# Patient Record
Sex: Male | Born: 1975 | Race: White | Hispanic: No | Marital: Single | Smoking: Current every day smoker
Health system: Southern US, Community
[De-identification: ages and names within clinical notes are randomized; demographics above are authoritative.]

## PROBLEM LIST (undated history)

## (undated) DIAGNOSIS — K219 Gastro-esophageal reflux disease without esophagitis: Secondary | ICD-10-CM

## (undated) DIAGNOSIS — J45909 Unspecified asthma, uncomplicated: Secondary | ICD-10-CM

## (undated) DIAGNOSIS — M1990 Unspecified osteoarthritis, unspecified site: Secondary | ICD-10-CM

## (undated) DIAGNOSIS — I10 Essential (primary) hypertension: Secondary | ICD-10-CM

## (undated) DIAGNOSIS — Z8719 Personal history of other diseases of the digestive system: Secondary | ICD-10-CM

## (undated) HISTORY — PX: HERNIA REPAIR: SHX51

## (undated) HISTORY — PX: TONSILLECTOMY: SUR1361

## (undated) HISTORY — PX: OTHER SURGICAL HISTORY: SHX169

---

## 2019-03-02 ENCOUNTER — Other Ambulatory Visit: Payer: Self-pay

## 2019-03-02 DIAGNOSIS — Z20822 Contact with and (suspected) exposure to covid-19: Secondary | ICD-10-CM

## 2019-03-04 LAB — NOVEL CORONAVIRUS, NAA: SARS-CoV-2, NAA: NOT DETECTED

## 2019-07-31 DIAGNOSIS — L03114 Cellulitis of left upper limb: Secondary | ICD-10-CM

## 2019-09-08 ENCOUNTER — Other Ambulatory Visit: Payer: Self-pay | Admitting: Orthopedic Surgery

## 2019-11-16 ENCOUNTER — Encounter (HOSPITAL_COMMUNITY): Payer: Self-pay

## 2019-11-16 ENCOUNTER — Other Ambulatory Visit: Payer: Self-pay

## 2019-11-16 ENCOUNTER — Encounter (HOSPITAL_COMMUNITY)
Admission: RE | Admit: 2019-11-16 | Discharge: 2019-11-16 | Disposition: A | Discharge status: Home / Self Care | Payer: Self-pay | Source: Ambulatory Visit | Attending: Orthopedic Surgery | Admitting: Orthopedic Surgery

## 2019-11-16 ENCOUNTER — Other Ambulatory Visit (HOSPITAL_COMMUNITY)
Admission: RE | Admit: 2019-11-16 | Discharge: 2019-11-16 | Disposition: A | Discharge status: Home / Self Care | Payer: HRSA Program | Source: Ambulatory Visit | Attending: Orthopedic Surgery | Admitting: Orthopedic Surgery

## 2019-11-16 DIAGNOSIS — M4316 Spondylolisthesis, lumbar region: Secondary | ICD-10-CM | POA: Insufficient documentation

## 2019-11-16 DIAGNOSIS — Z01818 Encounter for other preprocedural examination: Secondary | ICD-10-CM | POA: Insufficient documentation

## 2019-11-16 DIAGNOSIS — M48061 Spinal stenosis, lumbar region without neurogenic claudication: Secondary | ICD-10-CM | POA: Insufficient documentation

## 2019-11-16 DIAGNOSIS — Z20822 Contact with and (suspected) exposure to covid-19: Secondary | ICD-10-CM | POA: Insufficient documentation

## 2019-11-16 DIAGNOSIS — I10 Essential (primary) hypertension: Secondary | ICD-10-CM | POA: Insufficient documentation

## 2019-11-16 DIAGNOSIS — Z01812 Encounter for preprocedural laboratory examination: Secondary | ICD-10-CM | POA: Diagnosis present

## 2019-11-16 HISTORY — DX: Unspecified osteoarthritis, unspecified site: M19.90

## 2019-11-16 HISTORY — DX: Essential (primary) hypertension: I10

## 2019-11-16 HISTORY — DX: Gastro-esophageal reflux disease without esophagitis: K21.9

## 2019-11-16 HISTORY — DX: Personal history of other diseases of the digestive system: Z87.19

## 2019-11-16 HISTORY — DX: Unspecified asthma, uncomplicated: J45.909

## 2019-11-16 LAB — COMPREHENSIVE METABOLIC PANEL
ALT: 14 U/L (ref 0–44)
AST: 13 U/L — ABNORMAL LOW (ref 15–41)
Albumin: 3.8 g/dL (ref 3.5–5.0)
Alkaline Phosphatase: 49 U/L (ref 38–126)
Anion gap: 8 (ref 5–15)
BUN: 16 mg/dL (ref 6–20)
CO2: 23 mmol/L (ref 22–32)
Calcium: 9.2 mg/dL (ref 8.9–10.3)
Chloride: 108 mmol/L (ref 98–111)
Creatinine, Ser: 0.8 mg/dL (ref 0.61–1.24)
GFR calc Af Amer: >60 mL/min (ref >60)
GFR calc non Af Amer: >60 mL/min (ref >60)
Glucose, Bld: 96 mg/dL (ref 70–99)
Potassium: 4.3 mmol/L (ref 3.5–5.1)
Sodium: 139 mmol/L (ref 135–145)
Total Bilirubin: 0.7 mg/dL (ref 0.3–1.2)
Total Protein: 6.5 g/dL (ref 6.5–8.1)

## 2019-11-16 LAB — CBC WITH DIFFERENTIAL/PLATELET
Abs Immature Granulocytes: 0.03 10*3/uL (ref 0.00–0.07)
Basophils Absolute: 0.1 10*3/uL (ref 0.0–0.1)
Basophils Relative: 1 %
Eosinophils Absolute: 0.3 10*3/uL (ref 0.0–0.5)
Eosinophils Relative: 4 %
HCT: 48.5 % (ref 39.0–52.0)
Hemoglobin: 16.6 g/dL (ref 13.0–17.0)
Immature Granulocytes: 0 %
Lymphocytes Relative: 31 %
Lymphs Abs: 2.3 10*3/uL (ref 0.7–4.0)
MCH: 33.1 pg (ref 26.0–34.0)
MCHC: 34.2 g/dL (ref 30.0–36.0)
MCV: 96.6 fL (ref 80.0–100.0)
Monocytes Absolute: 0.9 10*3/uL (ref 0.1–1.0)
Monocytes Relative: 11 %
Neutro Abs: 4 10*3/uL (ref 1.7–7.7)
Neutrophils Relative %: 53 %
Platelets: 354 10*3/uL (ref 150–400)
RBC: 5.02 MIL/uL (ref 4.22–5.81)
RDW: 12.8 % (ref 11.5–15.5)
WBC: 7.5 10*3/uL (ref 4.0–10.5)
nRBC: 0 % (ref 0.0–0.2)

## 2019-11-16 LAB — SARS CORONAVIRUS 2 (TAT 6-24 HRS): SARS Coronavirus 2: NEGATIVE

## 2019-11-16 LAB — URINALYSIS, ROUTINE W REFLEX MICROSCOPIC
Bacteria, UA: NONE SEEN
Bilirubin Urine: NEGATIVE
Glucose, UA: NEGATIVE mg/dL
Ketones, ur: NEGATIVE mg/dL
Leukocytes,Ua: NEGATIVE
Nitrite: NEGATIVE
Protein, ur: NEGATIVE mg/dL
Specific Gravity, Urine: 1.008 (ref 1.005–1.030)
pH: 5 (ref 5.0–8.0)

## 2019-11-16 LAB — TYPE AND SCREEN
ABO/RH(D): O POS
Antibody Screen: NEGATIVE

## 2019-11-16 LAB — SURGICAL PCR SCREEN
MRSA, PCR: NEGATIVE
Staphylococcus aureus: NEGATIVE

## 2019-11-16 LAB — PROTIME-INR
INR: 0.9 (ref 0.8–1.2)
Prothrombin Time: 11.7 seconds (ref 11.4–15.2)

## 2019-11-16 LAB — APTT: aPTT: 32 seconds (ref 24–36)

## 2019-11-16 NOTE — Pre-Procedure Instructions (Signed)
Gregory Dawson  11/16/2019    Your procedure is scheduled on Wednesday, November 18, 2019 at 8:30 AM.   Report to [facility] Entrance "A" Admitting Office at 6:30 AM.   Call this number if you have problems the morning of surgery: [phone]   Questions prior to day of surgery, please call [phone] between 8 & 4 PM.   Remember:  Do not eat food after midnight Tuesday, 11/17/19.  You may drink clear liquids until 5:30 AM.  Clear liquids allowed are: Water, Juice (non-citric and without pulp - diabetics please choose diet or no sugar options), Carbonated beverages - (diabetics please choose diet or no sugar options), Clear Tea, Black Coffee only (no creamer, milk or cream including half and half) and Gatorade (diabetics please choose diet or no sugar options)   Drink the Pre-Surgery Ensure drink between 5:15 AM and 5:30 AM day of surgery. This will be the last liquid you will drink prior to surgery.    Take these medicines the morning of surgery with A SIP OF WATER: Famotidine (Pepcid)   Stop Aspirin products (Goody's, BC Powders, etc) and NSAIDS (Mobic, Meloxicam, Aleve, Ibuprofen, etc) as of today prior to surgery. Do not use Multivitamins, Herbal medications or Fish oil prior to surgery.    Do not wear jewelry.  Do not wear lotions, powders, cologne or deodorant.  Men may shave face and neck.  Do not bring valuables to the hospital.  [facility] is not responsible for any belongings or valuables.  Contacts, dentures or bridgework may not be worn into surgery.  Leave your suitcase in the car.  After surgery it may be brought to your room.  For patients admitted to the hospital, discharge time will be determined by your treatment team.  [facility] - Preparing for Surgery  Before surgery, you can play an important role.  Because skin is not sterile, your skin needs to be as free of germs as possible.  You can reduce the number of germs on you skin by washing with CHG  (chlorahexidine gluconate) soap before surgery.  CHG is an antiseptic cleaner which kills germs and bonds with the skin to continue killing germs even after washing.  Oral Hygiene is also important in reducing the risk of infection.  Remember to brush your teeth with your regular toothpaste the morning of surgery.  Please DO NOT use if you have an allergy to CHG or antibacterial soaps.  If your skin becomes reddened/irritated stop using the CHG and inform your nurse when you arrive at Short Stay.  Do not shave (including legs and underarms) for at least 48 hours prior to the first CHG shower.  You may shave your face.  Please follow these instructions carefully:   1.  Shower with CHG Soap the night before surgery and the morning of Surgery.  2.  If you choose to wash your hair, wash your hair first as usual with your normal shampoo.  3.  After you shampoo, rinse your hair and body thoroughly to remove the shampoo. 4.  Use CHG as you would any other liquid soap.  You can apply chg directly to the skin and wash gently with a      scrungie or washcloth.           5.  Apply the CHG Soap to your body ONLY FROM THE NECK DOWN.   Do not use on open wounds or open sores. Avoid contact with your eyes, ears, mouth and  genitals (private parts).  Wash genitals (private parts) with your normal soap - do this prior to using CHG soap.  6.  Wash thoroughly, paying special attention to the area where your surgery will be performed.  7.  Thoroughly rinse your body with warm water from the neck down.  8.  DO NOT shower/wash with your normal soap after using and rinsing off the CHG Soap.  9.  Pat yourself dry with a clean towel.            10.  Wear clean pajamas.            11.  Place clean sheets on your bed the night of your first shower and do not sleep with pets.  Day of Surgery  Shower as above. Do not apply any lotions/deodorants the morning of surgery.   Please wear clean clothes to the  hospital. Remember to brush your teeth with toothpaste.  Please read over the fact sheets that you were given.

## 2019-11-16 NOTE — Progress Notes (Signed)
PCP - none.Marland KitchenPATIENT GOES TO ED OR URGENT IF NEEDED Cardiologist - NA   Chest x-ray - NA EKG - DONE TODAY Stress Test NA-  ECHO - NA Cardiac Cath - NA   -  Blood Thinner Instructions:NA Aspirin Instructions:STOP  ERAS Protcol -YES     PRE-SURGERY Ensure or G2- DRINK GIVEN  COVID TEST- FOR TODAY   Anesthesia review: HX HTN  Patient denies shortness of breath, fever, cough and chest pain at PAT appointment   All instructions explained to the patient, with a verbal understanding of the material. Patient agrees to go over the instructions while at home for a better understanding. Patient also instructed to self quarantine after being tested for COVID-19. The opportunity to ask questions was provided.

## 2019-11-17 NOTE — Anesthesia Preprocedure Evaluation (Addendum)
Anesthesia Evaluation  Patient identified by MRN, date of birth, ID band Patient awake    Reviewed: Allergy & Precautions, NPO status , Patient's Chart, lab work & pertinent test results  History of Anesthesia Complications Negative for: history of anesthetic complications  Airway Mallampati: I  TM Distance: >3 FB Neck ROM: Full    Dental  (+) Missing, Poor Dentition, Dental Advisory Given   Pulmonary asthma , Current SmokerPatient did not abstain from smoking.,     + wheezing      Cardiovascular hypertension, Pt. on medications negative cardio ROS Normal cardiovascular exam     Neuro/Psych    GI/Hepatic Neg liver ROS, hiatal hernia, GERD  Medicated,  Endo/Other  negative endocrine ROS  Renal/GU negative Renal ROS     Musculoskeletal negative musculoskeletal ROS (+)   Abdominal   Peds  Hematology negative hematology ROS (+)   Anesthesia Other Findings   Reproductive/Obstetrics                            Anesthesia Physical Anesthesia Plan  ASA: II  Anesthesia Plan: General   Post-op Pain Management:    Induction: Intravenous  PONV Risk Score and Plan: 2 and Ondansetron and Dexamethasone  Airway Management Planned: Oral ETT  Additional Equipment:   Intra-op Plan:   Post-operative Plan: Extubation in OR  Informed Consent: I have reviewed the patients History and Physical, chart, labs and discussed the procedure including the risks, benefits and alternatives for the proposed anesthesia with the patient or authorized representative who has indicated his/her understanding and acceptance.     Dental advisory given  Plan Discussed with: Anesthesiologist and CRNA  Anesthesia Plan Comments:        Anesthesia Quick Evaluation

## 2019-11-18 ENCOUNTER — Encounter (HOSPITAL_COMMUNITY): Payer: Self-pay | Admitting: Orthopedic Surgery

## 2019-11-18 ENCOUNTER — Inpatient Hospital Stay (HOSPITAL_COMMUNITY): Payer: No Typology Code available for payment source | Admitting: Anesthesiology

## 2019-11-18 ENCOUNTER — Other Ambulatory Visit: Payer: Self-pay

## 2019-11-18 ENCOUNTER — Inpatient Hospital Stay (HOSPITAL_COMMUNITY)
Admission: RE | Disposition: A | Discharge status: Home / Self Care | Payer: Self-pay | Source: Home / Self Care | Attending: Orthopedic Surgery

## 2019-11-18 ENCOUNTER — Inpatient Hospital Stay (HOSPITAL_COMMUNITY)
Admission: RE | Admit: 2019-11-18 | Discharge: 2019-11-19 | DRG: 455 | Disposition: A | Discharge status: Home / Self Care | Payer: No Typology Code available for payment source | Attending: Orthopedic Surgery | Admitting: Orthopedic Surgery

## 2019-11-18 ENCOUNTER — Ambulatory Visit (HOSPITAL_COMMUNITY): Payer: Self-pay | Attending: Orthopedic Surgery

## 2019-11-18 ENCOUNTER — Inpatient Hospital Stay (HOSPITAL_COMMUNITY): Payer: Self-pay

## 2019-11-18 ENCOUNTER — Inpatient Hospital Stay (HOSPITAL_COMMUNITY): Payer: No Typology Code available for payment source | Admitting: Physician Assistant

## 2019-11-18 DIAGNOSIS — Z79899 Other long term (current) drug therapy: Secondary | ICD-10-CM

## 2019-11-18 DIAGNOSIS — M48062 Spinal stenosis, lumbar region with neurogenic claudication: Secondary | ICD-10-CM | POA: Diagnosis present

## 2019-11-18 DIAGNOSIS — Z419 Encounter for procedure for purposes other than remedying health state, unspecified: Secondary | ICD-10-CM

## 2019-11-18 DIAGNOSIS — Z7982 Long term (current) use of aspirin: Secondary | ICD-10-CM

## 2019-11-18 DIAGNOSIS — I10 Essential (primary) hypertension: Secondary | ICD-10-CM | POA: Diagnosis present

## 2019-11-18 DIAGNOSIS — Y990 Civilian activity done for income or pay: Secondary | ICD-10-CM | POA: Diagnosis not present

## 2019-11-18 DIAGNOSIS — K219 Gastro-esophageal reflux disease without esophagitis: Secondary | ICD-10-CM | POA: Diagnosis present

## 2019-11-18 DIAGNOSIS — M4316 Spondylolisthesis, lumbar region: Secondary | ICD-10-CM | POA: Diagnosis present

## 2019-11-18 DIAGNOSIS — M4800 Spinal stenosis, site unspecified: Secondary | ICD-10-CM | POA: Diagnosis present

## 2019-11-18 DIAGNOSIS — M5116 Intervertebral disc disorders with radiculopathy, lumbar region: Secondary | ICD-10-CM | POA: Diagnosis present

## 2019-11-18 DIAGNOSIS — M1990 Unspecified osteoarthritis, unspecified site: Secondary | ICD-10-CM | POA: Diagnosis present

## 2019-11-18 DIAGNOSIS — T1490XS Injury, unspecified, sequela: Secondary | ICD-10-CM

## 2019-11-18 DIAGNOSIS — F17210 Nicotine dependence, cigarettes, uncomplicated: Secondary | ICD-10-CM | POA: Diagnosis present

## 2019-11-18 DIAGNOSIS — Z791 Long term (current) use of non-steroidal anti-inflammatories (NSAID): Secondary | ICD-10-CM

## 2019-11-18 HISTORY — PX: TRANSFORAMINAL LUMBAR INTERBODY FUSION (TLIF) WITH PEDICLE SCREW FIXATION 1 LEVEL: SHX6141

## 2019-11-18 LAB — ABO/RH: ABO/RH(D): O POS

## 2019-11-18 SURGERY — TRANSFORAMINAL LUMBAR INTERBODY FUSION (TLIF) WITH PEDICLE SCREW FIXATION 1 LEVEL
Anesthesia: General | Site: Back | Laterality: Left

## 2019-11-18 MED ORDER — CHLORHEXIDINE GLUCONATE 0.12 % MT SOLN
15.0000 mL | Freq: Once | OROMUCOSAL | Status: AC
  Administered 2019-11-18: 15 mL via OROMUCOSAL
  Filled 2019-11-18: qty 15

## 2019-11-18 MED ORDER — PHENOL 1.4 % MT LIQD: 1.0000 | OROMUCOSAL | Status: DC | PRN

## 2019-11-18 MED ORDER — PROMETHAZINE HCL 25 MG/ML IJ SOLN: 6.2500 mg | INTRAMUSCULAR | Status: DC | PRN

## 2019-11-18 MED ORDER — ALBUMIN HUMAN 5 % IV SOLN: INTRAVENOUS | Status: DC | PRN

## 2019-11-18 MED ORDER — DOCUSATE SODIUM 100 MG PO CAPS
100.0000 mg | ORAL_CAPSULE | Freq: Two times a day (BID) | ORAL | Status: DC
  Administered 2019-11-18: 100 mg via ORAL
  Filled 2019-11-18: qty 1

## 2019-11-18 MED ORDER — POTASSIUM CHLORIDE IN NACL 20-0.9 MEQ/L-% IV SOLN: INTRAVENOUS | Status: DC

## 2019-11-18 MED ORDER — METHOCARBAMOL 1000 MG/10ML IJ SOLN
500.0000 mg | Freq: Four times a day (QID) | INTRAVENOUS | Status: DC | PRN
  Administered 2019-11-19: 500 mg via INTRAVENOUS
  Filled 2019-11-18 (×2): qty 5

## 2019-11-18 MED ORDER — ONDANSETRON HCL 4 MG/2ML IJ SOLN
INTRAMUSCULAR | Status: AC
  Filled 2019-11-18: qty 2

## 2019-11-18 MED ORDER — ACETAMINOPHEN 650 MG RE SUPP: 650.0000 mg | RECTAL | Status: DC | PRN

## 2019-11-18 MED ORDER — PROPOFOL 500 MG/50ML IV EMUL
INTRAVENOUS | Status: DC | PRN
  Administered 2019-11-18: 25 ug/kg/min via INTRAVENOUS

## 2019-11-18 MED ORDER — ALBUTEROL SULFATE (2.5 MG/3ML) 0.083% IN NEBU
2.5000 mg | INHALATION_SOLUTION | Freq: Once | RESPIRATORY_TRACT | Status: AC
  Administered 2019-11-18: 2.5 mg via RESPIRATORY_TRACT
  Filled 2019-11-18: qty 3

## 2019-11-18 MED ORDER — FLEET ENEMA 7-19 GM/118ML RE ENEM: 1.0000 | ENEMA | Freq: Once | RECTAL | Status: DC | PRN

## 2019-11-18 MED ORDER — MENTHOL 3 MG MT LOZG: 1.0000 | LOZENGE | OROMUCOSAL | Status: DC | PRN

## 2019-11-18 MED ORDER — CEFAZOLIN SODIUM-DEXTROSE 2-4 GM/100ML-% IV SOLN
2.0000 g | INTRAVENOUS | Status: AC
  Administered 2019-11-18: 2 g via INTRAVENOUS
  Filled 2019-11-18: qty 100

## 2019-11-18 MED ORDER — SUGAMMADEX SODIUM 200 MG/2ML IV SOLN
INTRAVENOUS | Status: DC | PRN
  Administered 2019-11-18: 200 mg via INTRAVENOUS

## 2019-11-18 MED ORDER — METHYLENE BLUE 0.5 % INJ SOLN
INTRAVENOUS | Status: AC
  Filled 2019-11-18: qty 10

## 2019-11-18 MED ORDER — FENTANYL CITRATE (PF) 250 MCG/5ML IJ SOLN
INTRAMUSCULAR | Status: AC
  Filled 2019-11-18: qty 5

## 2019-11-18 MED ORDER — MORPHINE SULFATE (PF) 2 MG/ML IV SOLN
1.0000 mg | INTRAVENOUS | Status: DC | PRN
  Administered 2019-11-18 (×2): 2 mg via INTRAVENOUS
  Filled 2019-11-18 (×2): qty 1

## 2019-11-18 MED ORDER — PHENYLEPHRINE HCL-NACL 10-0.9 MG/250ML-% IV SOLN
INTRAVENOUS | Status: DC | PRN
  Administered 2019-11-18: 30 ug/min via INTRAVENOUS

## 2019-11-18 MED ORDER — PROPOFOL 10 MG/ML IV BOLUS
INTRAVENOUS | Status: AC
  Filled 2019-11-18: qty 20

## 2019-11-18 MED ORDER — MIDAZOLAM HCL 2 MG/2ML IJ SOLN
INTRAMUSCULAR | Status: AC
  Filled 2019-11-18: qty 2

## 2019-11-18 MED ORDER — MIDAZOLAM HCL 2 MG/2ML IJ SOLN
INTRAMUSCULAR | Status: DC | PRN
  Administered 2019-11-18: 2 mg via INTRAVENOUS

## 2019-11-18 MED ORDER — OXYCODONE-ACETAMINOPHEN 5-325 MG PO TABS
1.0000 | ORAL_TABLET | ORAL | Status: DC | PRN
  Administered 2019-11-18: 1 via ORAL
  Administered 2019-11-19: 2 via ORAL
  Administered 2019-11-19 (×2): 1 via ORAL
  Filled 2019-11-18: qty 2
  Filled 2019-11-18 (×2): qty 1
  Filled 2019-11-18: qty 2

## 2019-11-18 MED ORDER — BUPIVACAINE LIPOSOME 1.3 % IJ SUSP
20.0000 mL | Freq: Once | INTRAMUSCULAR | Status: DC
  Filled 2019-11-18: qty 20

## 2019-11-18 MED ORDER — SENNOSIDES-DOCUSATE SODIUM 8.6-50 MG PO TABS: 1.0000 | ORAL_TABLET | Freq: Every evening | ORAL | Status: DC | PRN

## 2019-11-18 MED ORDER — ONDANSETRON HCL 4 MG/2ML IJ SOLN
4.0000 mg | Freq: Four times a day (QID) | INTRAMUSCULAR | Status: DC | PRN
  Administered 2019-11-18: 4 mg via INTRAVENOUS

## 2019-11-18 MED ORDER — METHYLENE BLUE 0.5 % INJ SOLN
INTRAVENOUS | Status: DC | PRN
  Administered 2019-11-18: .4 mL via SUBMUCOSAL

## 2019-11-18 MED ORDER — SODIUM CHLORIDE (PF) 0.9 % IJ SOLN
INTRAMUSCULAR | Status: DC | PRN
  Administered 2019-11-18: 20 mL

## 2019-11-18 MED ORDER — OXYCODONE-ACETAMINOPHEN 5-325 MG PO TABS
1.0000 | ORAL_TABLET | ORAL | 0 refills | Status: AC | PRN
Start: 2019-11-18 — End: ?

## 2019-11-18 MED ORDER — FENTANYL CITRATE (PF) 250 MCG/5ML IJ SOLN
INTRAMUSCULAR | Status: DC | PRN
  Administered 2019-11-18: 150 ug via INTRAVENOUS
  Administered 2019-11-18: 100 ug via INTRAVENOUS
  Administered 2019-11-18 (×3): 50 ug via INTRAVENOUS
  Administered 2019-11-18: 100 ug via INTRAVENOUS

## 2019-11-18 MED ORDER — GLYCOPYRROLATE PF 0.2 MG/ML IJ SOSY
PREFILLED_SYRINGE | INTRAMUSCULAR | Status: DC | PRN
  Administered 2019-11-18: .2 mg via INTRAVENOUS

## 2019-11-18 MED ORDER — THROMBIN (RECOMBINANT) 20000 UNITS EX SOLR
CUTANEOUS | Status: AC
  Filled 2019-11-18: qty 20000

## 2019-11-18 MED ORDER — ZOLPIDEM TARTRATE 5 MG PO TABS: 5.0000 mg | ORAL_TABLET | Freq: Every evening | ORAL | Status: DC | PRN

## 2019-11-18 MED ORDER — METHOCARBAMOL 500 MG PO TABS
500.0000 mg | ORAL_TABLET | Freq: Four times a day (QID) | ORAL | Status: DC | PRN
  Administered 2019-11-18 – 2019-11-19 (×3): 500 mg via ORAL
  Filled 2019-11-18 (×4): qty 1

## 2019-11-18 MED ORDER — HYDROXYZINE HCL 50 MG/ML IM SOLN
50.0000 mg | Freq: Four times a day (QID) | INTRAMUSCULAR | Status: DC | PRN
  Administered 2019-11-18: 50 mg via INTRAMUSCULAR
  Filled 2019-11-18: qty 1

## 2019-11-18 MED ORDER — SODIUM CHLORIDE 0.9% FLUSH: 3.0000 mL | Freq: Two times a day (BID) | INTRAVENOUS | Status: DC

## 2019-11-18 MED ORDER — SODIUM CHLORIDE 0.9 % IV SOLN: 250.0000 mL | INTRAVENOUS | Status: DC

## 2019-11-18 MED ORDER — EPINEPHRINE PF 1 MG/ML IJ SOLN
INTRAMUSCULAR | Status: AC
  Filled 2019-11-18: qty 1

## 2019-11-18 MED ORDER — ACETAMINOPHEN 500 MG PO TABS
1000.0000 mg | ORAL_TABLET | Freq: Once | ORAL | Status: AC
  Administered 2019-11-18: 1000 mg via ORAL
  Filled 2019-11-18: qty 2

## 2019-11-18 MED ORDER — ONDANSETRON HCL 4 MG PO TABS: 4.0000 mg | ORAL_TABLET | Freq: Four times a day (QID) | ORAL | Status: DC | PRN

## 2019-11-18 MED ORDER — ROCURONIUM BROMIDE 10 MG/ML (PF) SYRINGE
PREFILLED_SYRINGE | INTRAVENOUS | Status: AC
  Filled 2019-11-18: qty 10

## 2019-11-18 MED ORDER — ALUM & MAG HYDROXIDE-SIMETH 200-200-20 MG/5ML PO SUSP
30.0000 mL | Freq: Four times a day (QID) | ORAL | Status: DC | PRN
  Administered 2019-11-18: 30 mL via ORAL
  Filled 2019-11-18: qty 30

## 2019-11-18 MED ORDER — SODIUM CHLORIDE 0.9% FLUSH: 3.0000 mL | INTRAVENOUS | Status: DC | PRN

## 2019-11-18 MED ORDER — POVIDONE-IODINE 7.5 % EX SOLN: Freq: Once | CUTANEOUS | Status: DC

## 2019-11-18 MED ORDER — THROMBIN 20000 UNITS EX SOLR
CUTANEOUS | Status: DC | PRN
  Administered 2019-11-18: 20000 [IU] via TOPICAL

## 2019-11-18 MED ORDER — PROPOFOL 1000 MG/100ML IV EMUL
INTRAVENOUS | Status: AC
  Filled 2019-11-18: qty 100

## 2019-11-18 MED ORDER — BUPIVACAINE HCL (PF) 0.25 % IJ SOLN
INTRAMUSCULAR | Status: AC
  Filled 2019-11-18: qty 30

## 2019-11-18 MED ORDER — FENTANYL CITRATE (PF) 100 MCG/2ML IJ SOLN: 25.0000 ug | INTRAMUSCULAR | Status: DC | PRN

## 2019-11-18 MED ORDER — ONDANSETRON HCL 4 MG/2ML IJ SOLN
INTRAMUSCULAR | Status: DC | PRN
  Administered 2019-11-18: 4 mg via INTRAVENOUS

## 2019-11-18 MED ORDER — LISINOPRIL 10 MG PO TABS: 10.0000 mg | ORAL_TABLET | Freq: Every day | ORAL | Status: DC

## 2019-11-18 MED ORDER — ROCURONIUM BROMIDE 10 MG/ML (PF) SYRINGE
PREFILLED_SYRINGE | INTRAVENOUS | Status: DC | PRN
  Administered 2019-11-18: 100 mg via INTRAVENOUS
  Administered 2019-11-18: 20 mg via INTRAVENOUS

## 2019-11-18 MED ORDER — ORAL CARE MOUTH RINSE: 15.0000 mL | Freq: Once | OROMUCOSAL | Status: AC

## 2019-11-18 MED ORDER — LIDOCAINE 2% (20 MG/ML) 5 ML SYRINGE
INTRAMUSCULAR | Status: DC | PRN
  Administered 2019-11-18: 100 mg via INTRAVENOUS

## 2019-11-18 MED ORDER — BISACODYL 5 MG PO TBEC: 5.0000 mg | DELAYED_RELEASE_TABLET | Freq: Every day | ORAL | Status: DC | PRN

## 2019-11-18 MED ORDER — PROPOFOL 10 MG/ML IV BOLUS
INTRAVENOUS | Status: DC | PRN
  Administered 2019-11-18: 200 mg via INTRAVENOUS

## 2019-11-18 MED ORDER — CELECOXIB 200 MG PO CAPS
200.0000 mg | ORAL_CAPSULE | Freq: Once | ORAL | Status: AC
  Administered 2019-11-18: 200 mg via ORAL
  Filled 2019-11-18: qty 1

## 2019-11-18 MED ORDER — FAMOTIDINE 20 MG PO TABS
20.0000 mg | ORAL_TABLET | Freq: Two times a day (BID) | ORAL | Status: DC
  Administered 2019-11-18: 20 mg via ORAL
  Filled 2019-11-18: qty 1

## 2019-11-18 MED ORDER — 0.9 % SODIUM CHLORIDE (POUR BTL) OPTIME
TOPICAL | Status: DC | PRN
  Administered 2019-11-18 (×3): 1000 mL

## 2019-11-18 MED ORDER — BUPIVACAINE LIPOSOME 1.3 % IJ SUSP
INTRAMUSCULAR | Status: DC | PRN
  Administered 2019-11-18: 20 mL

## 2019-11-18 MED ORDER — METHOCARBAMOL 500 MG PO TABS: 500.0000 mg | ORAL_TABLET | Freq: Four times a day (QID) | ORAL | 1 refills | Status: AC | PRN

## 2019-11-18 MED ORDER — CEFAZOLIN SODIUM-DEXTROSE 2-4 GM/100ML-% IV SOLN
2.0000 g | Freq: Three times a day (TID) | INTRAVENOUS | Status: AC
  Administered 2019-11-18 – 2019-11-19 (×2): 2 g via INTRAVENOUS
  Filled 2019-11-18 (×2): qty 100

## 2019-11-18 MED ORDER — ACETAMINOPHEN 325 MG PO TABS
650.0000 mg | ORAL_TABLET | ORAL | Status: DC | PRN
  Filled 2019-11-18: qty 2

## 2019-11-18 MED ORDER — GLYCOPYRROLATE PF 0.2 MG/ML IJ SOSY
PREFILLED_SYRINGE | INTRAMUSCULAR | Status: AC
  Filled 2019-11-18: qty 1

## 2019-11-18 MED ORDER — BUPIVACAINE-EPINEPHRINE 0.25% -1:200000 IJ SOLN
INTRAMUSCULAR | Status: DC | PRN
  Administered 2019-11-18: 4 mL
  Administered 2019-11-18: 20 mL

## 2019-11-18 MED ORDER — LIDOCAINE 2% (20 MG/ML) 5 ML SYRINGE
INTRAMUSCULAR | Status: AC
  Filled 2019-11-18: qty 5

## 2019-11-18 MED ORDER — LACTATED RINGERS IV SOLN: INTRAVENOUS | Status: DC

## 2019-11-18 SURGICAL SUPPLY — 94 items
BENZOIN TINCTURE PRP APPL 2/3 (GAUZE/BANDAGES/DRESSINGS) ×3 IMPLANT
BLADE CLIPPER SURG (BLADE) IMPLANT
BONE VIVIGEN FORMABLE 10CC (Bone Implant) ×3 IMPLANT
BUR PRESCISION 1.7 ELITE (BURR) ×3 IMPLANT
BUR ROUND FLUTED 5 RND (BURR) ×2 IMPLANT
BUR ROUND FLUTED 5MM RND (BURR) ×1
BUR ROUND PRECISION 4.0 (BURR) IMPLANT
BUR ROUND PRECISION 4.0MM (BURR)
BUR SABER RD CUTTING 3.0 (BURR) IMPLANT
BUR SABER RD CUTTING 3.0MM (BURR)
CAGE CONCORDE BULLET 9X11X27 (Cage) ×1 IMPLANT
CAGE CONCORDE BULLET 9X11X27MM (Cage) ×1 IMPLANT
CAGE SPNL 5D BLT NOSE 27X9X11 (Cage) ×1 IMPLANT
CARTRIDGE OIL MAESTRO DRILL (MISCELLANEOUS) ×1 IMPLANT
CLOSURE STERI-STRIP 1/2X4 (GAUZE/BANDAGES/DRESSINGS) ×1
CLOSURE WOUND 1/2 X4 (GAUZE/BANDAGES/DRESSINGS) ×2
CLSR STERI-STRIP ANTIMIC 1/2X4 (GAUZE/BANDAGES/DRESSINGS) ×2 IMPLANT
CNTNR URN SCR LID CUP LEK RST (MISCELLANEOUS) ×1 IMPLANT
CONT SPEC 4OZ STRL OR WHT (MISCELLANEOUS) ×2
COVER BACK TABLE 60X90IN (DRAPES) ×3 IMPLANT
COVER MAYO STAND STRL (DRAPES) ×6 IMPLANT
COVER SURGICAL LIGHT HANDLE (MISCELLANEOUS) ×3 IMPLANT
COVER WAND RF STERILE (DRAPES) ×3 IMPLANT
DIFFUSER DRILL AIR PNEUMATIC (MISCELLANEOUS) ×3 IMPLANT
DRAIN CHANNEL 15F RND FF W/TCR (WOUND CARE) IMPLANT
DRAPE C-ARM 42X72 X-RAY (DRAPES) ×3 IMPLANT
DRAPE C-ARMOR (DRAPES) IMPLANT
DRAPE POUCH INSTRU U-SHP 10X18 (DRAPES) ×3 IMPLANT
DRAPE SURG 17X23 STRL (DRAPES) ×12 IMPLANT
DURAPREP 26ML APPLICATOR (WOUND CARE) ×3 IMPLANT
ELECT BLADE 4.0 EZ CLEAN MEGAD (MISCELLANEOUS) ×3
ELECT CAUTERY BLADE 6.4 (BLADE) ×3 IMPLANT
ELECT REM PT RETURN 9FT ADLT (ELECTROSURGICAL) ×3
ELECTRODE BLDE 4.0 EZ CLN MEGD (MISCELLANEOUS) ×1 IMPLANT
ELECTRODE REM PT RTRN 9FT ADLT (ELECTROSURGICAL) ×1 IMPLANT
EVACUATOR SILICONE 100CC (DRAIN) IMPLANT
FEE INTRAOP MONITOR IMPULS NCS (MISCELLANEOUS) ×1 IMPLANT
FILTER STRAW FLUID ASPIR (MISCELLANEOUS) ×3 IMPLANT
GAUZE 4X4 16PLY RFD (DISPOSABLE) ×3 IMPLANT
GAUZE SPONGE 4X4 12PLY STRL (GAUZE/BANDAGES/DRESSINGS) ×3 IMPLANT
GLOVE BIO SURGEON STRL SZ7 (GLOVE) ×3 IMPLANT
GLOVE BIO SURGEON STRL SZ8 (GLOVE) ×3 IMPLANT
GLOVE BIOGEL PI IND STRL 7.0 (GLOVE) ×1 IMPLANT
GLOVE BIOGEL PI IND STRL 8 (GLOVE) ×1 IMPLANT
GLOVE BIOGEL PI INDICATOR 7.0 (GLOVE) ×2
GLOVE BIOGEL PI INDICATOR 8 (GLOVE) ×2
GOWN STRL REUS W/ TWL LRG LVL3 (GOWN DISPOSABLE) ×2 IMPLANT
GOWN STRL REUS W/ TWL XL LVL3 (GOWN DISPOSABLE) ×1 IMPLANT
GOWN STRL REUS W/TWL LRG LVL3 (GOWN DISPOSABLE) ×4
GOWN STRL REUS W/TWL XL LVL3 (GOWN DISPOSABLE) ×2
INTRAOP MONITOR FEE IMPULS NCS (MISCELLANEOUS) ×1
INTRAOP MONITOR FEE IMPULSE (MISCELLANEOUS) ×2
IV CATH 14GX2 1/4 (CATHETERS) ×3 IMPLANT
KIT BASIN OR (CUSTOM PROCEDURE TRAY) ×3 IMPLANT
KIT POSITION SURG JACKSON T1 (MISCELLANEOUS) ×3 IMPLANT
KIT TURNOVER KIT B (KITS) ×3 IMPLANT
MARKER SKIN DUAL TIP RULER LAB (MISCELLANEOUS) ×6 IMPLANT
NEEDLE 18GX1X1/2 (RX/OR ONLY) (NEEDLE) ×3 IMPLANT
NEEDLE 22X1 1/2 (OR ONLY) (NEEDLE) ×6 IMPLANT
NEEDLE HYPO 25GX1X1/2 BEV (NEEDLE) ×3 IMPLANT
NEEDLE SPNL 18GX3.5 QUINCKE PK (NEEDLE) ×6 IMPLANT
NS IRRIG 1000ML POUR BTL (IV SOLUTION) ×3 IMPLANT
OIL CARTRIDGE MAESTRO DRILL (MISCELLANEOUS) ×3
PACK LAMINECTOMY ORTHO (CUSTOM PROCEDURE TRAY) ×3 IMPLANT
PACK UNIVERSAL I (CUSTOM PROCEDURE TRAY) ×3 IMPLANT
PAD ARMBOARD 7.5X6 YLW CONV (MISCELLANEOUS) ×6 IMPLANT
PATTIES SURGICAL .5 X1 (DISPOSABLE) ×3 IMPLANT
PATTIES SURGICAL .5X1.5 (GAUZE/BANDAGES/DRESSINGS) ×3 IMPLANT
PROBE PED SCREW MONOP 3 BT NCS (MISCELLANEOUS) ×1 IMPLANT
ROD PRE BENT EXPEDIUM 35MM (Rod) ×6 IMPLANT
SCREW PROBE PEDICLE MONOPOLAR (MISCELLANEOUS) ×2
SCREW SET SINGLE INNER (Screw) ×12 IMPLANT
SCREW VIPER CORT FIX 6.00X30 (Screw) ×12 IMPLANT
SPONGE INTESTINAL PEANUT (DISPOSABLE) ×3 IMPLANT
SPONGE SURGIFOAM ABS GEL 100 (HEMOSTASIS) ×3 IMPLANT
STRIP CLOSURE SKIN 1/2X4 (GAUZE/BANDAGES/DRESSINGS) ×4 IMPLANT
SURGIFLO W/THROMBIN 8M KIT (HEMOSTASIS) IMPLANT
SUT MNCRL AB 4-0 PS2 18 (SUTURE) ×3 IMPLANT
SUT VIC AB 0 CT1 18XCR BRD 8 (SUTURE) ×1 IMPLANT
SUT VIC AB 0 CT1 8-18 (SUTURE) ×2
SUT VIC AB 1 CT1 18XCR BRD 8 (SUTURE) ×1 IMPLANT
SUT VIC AB 1 CT1 8-18 (SUTURE) ×2
SUT VIC AB 2-0 CT2 18 VCP726D (SUTURE) ×3 IMPLANT
SYR 20ML LL LF (SYRINGE) ×6 IMPLANT
SYR BULB IRRIG 60ML STRL (SYRINGE) ×3 IMPLANT
SYR CONTROL 10ML LL (SYRINGE) ×6 IMPLANT
SYR TB 1ML LUER SLIP (SYRINGE) ×3 IMPLANT
TAP EXPEDIUM DL 4.35 (INSTRUMENTS) ×3 IMPLANT
TAP EXPEDIUM DL 5.0 (INSTRUMENTS) ×3 IMPLANT
TAP EXPEDIUM DL 6.0 (INSTRUMENTS) ×3 IMPLANT
TAPE CLOTH SURG 4X10 WHT LF (GAUZE/BANDAGES/DRESSINGS) ×3 IMPLANT
TRAY FOLEY MTR SLVR 16FR STAT (SET/KITS/TRAYS/PACK) ×3 IMPLANT
WATER STERILE IRR 1000ML POUR (IV SOLUTION) ×3 IMPLANT
YANKAUER SUCT BULB TIP NO VENT (SUCTIONS) ×3 IMPLANT

## 2019-11-18 NOTE — H&P (Signed)
PREOPERATIVE H&P  Chief Complaint: Bilateral leg pain  HPI: Gregory Dawson is a 44 y.o. male who presents with ongoing pain in the bilateral legs  MRI reveals severe spinal stenosis and instability at L4/5  Patient has failed multiple forms of conservative care and continues to have pain (see office notes for additional details regarding the patient's full course of treatment)  Past Medical History:  Diagnosis Date  . Arthritis   . Asthma   . GERD (gastroesophageal reflux disease)   . History of hiatal hernia   . Hypertension     Social History   Socioeconomic History  . Marital status: Single    Spouse name: Not on file  . Number of children: Not on file  . Years of education: Not on file  . Highest education level: Not on file  Occupational History  . Not on file  Tobacco Use  . Smoking status: Current Every Day Smoker    Packs/day: 1.00    Years: 35.00    Pack years: 35.00    Types: Cigarettes  Substance and Sexual Activity  . 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 Drug use: Not Currently    Types: Heroin, "Crack" cocaine, Marijuana    Comment: 4 yrs clean  . Sexual activity: Not on file  Other Topics Concern  . Not on file  Social History Narrative  . Not on file   Social Determinants of Health   Financial Resource Strain:   . Difficulty of Paying Living Expenses:   Food Insecurity:   . Worried About Programme researcher, broadcasting/film/video in the Last Year:   . Barista in the Last Year:   Transportation Needs:   . Freight forwarder (Medical):   Marland Kitchen Lack of Transportation (Non-Medical):   Physical Activity:   . Days of Exercise per Week:   . Minutes of Exercise per Session:   Stress:   . Feeling of Stress :   Social Connections:   . Frequency of Communication with Friends and Family:   . Frequency of Social Gatherings with Friends and Family:   . Attends Religious Services:   . Active Member of Clubs or Organizations:   . Attends Occupational hygienist Meetings:   Marland Kitchen Marital Status:    No family history on file. No Known Allergies Prior to Admission medications   Medication Sig Start Date End Date Taking? Authorizing Provider  Aspirin-Acetaminophen-Caffeine (GOODYS EXTRA STRENGTH) [phone] MG PACK Take 1 packet by mouth daily as needed (pain/headache).   Yes [provider]  famotidine (PEPCID) 20 MG tablet Take 20 mg by mouth 2 (two) times daily.   Yes [provider]  lisinopril (ZESTRIL) 10 MG tablet Take 10 mg by mouth daily. 10/21/19  Yes [provider]  meloxicam (MOBIC) 15 MG tablet Take 15 mg by mouth daily. 10/31/19  Yes [provider]     All other systems have been reviewed and were otherwise negative with the exception of those mentioned in the HPI and as above.  Physical Exam: Vitals:   11/18/19 0735  BP: (!) 129/93  Pulse: 61  Resp: 20  Temp: 97.6 F (36.4 C)  SpO2: 97%    Body mass index is 29.05 kg/m.  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

## 2019-11-18 NOTE — Anesthesia Postprocedure Evaluation (Signed)
Anesthesia Post Note  Patient: Gregory Dawson  Procedure(s) Performed: LEFT-SIDED LUMBAR 4- LUMBAR 5 TRANSFORAMINAL LUMBAR INTERBODY FUSION WITH INSTRUMENTATION AND ALLOGRAFT (Left Back)     Patient location during evaluation: PACU Anesthesia Type: General Level of consciousness: sedated Pain management: pain level controlled Vital Signs Assessment: post-procedure vital signs reviewed and stable Respiratory status: spontaneous breathing and respiratory function stable Cardiovascular status: stable Postop Assessment: no apparent nausea or vomiting Anesthetic complications: no   No complications documented.  Last Vitals:  Vitals:   11/18/19 1350 11/18/19 1428  BP: (!) 150/96 (!) 165/98  Pulse: 66 62  Resp: 15 16  Temp: 36.6 C 36.4 C  SpO2: 96% 99%    Last Pain:  Vitals:   11/18/19 1510  TempSrc:   PainSc: 5                  Orene Abbasi DANIEL

## 2019-11-18 NOTE — Anesthesia Procedure Notes (Signed)
Procedure Name: Intubation Date/Time: 11/18/2019 8:47 AM Performed by: Shary Decamp, CRNA Pre-anesthesia Checklist: Patient identified, Emergency Drugs available, Suction available and Patient being monitored Patient Re-evaluated:Patient Re-evaluated prior to induction Oxygen Delivery Method: Circle system utilized Preoxygenation: Pre-oxygenation with 100% oxygen Induction Type: IV induction Ventilation: Mask ventilation without difficulty Laryngoscope Size: Miller and 2 Grade View: Grade I Tube type: Oral Tube size: 7.5 mm Number of attempts: 1 Airway Equipment and Method: Stylet and Oral airway Placement Confirmation: ETT inserted through vocal cords under direct vision,  positive ETCO2 and breath sounds checked- equal and bilateral Secured at: 22 cm Tube secured with: Tape Dental Injury: Teeth and Oropharynx as per pre-operative assessment

## 2019-11-18 NOTE — Op Note (Signed)
PATIENT NAME: Gregory Dawson   MEDICAL RECORD NO.:   [id number]   DATE OF BIRTH: 12-Dec-1975   DATE OF PROCEDURE: 11/18/2019                               OPERATIVE REPORT     PREOPERATIVE DIAGNOSES: 1. Neurogenic claudication 2. L4-5 spinal stenosis, severe 3. L4-5 degenerative disc disease and spondylolisthesis   POSTOPERATIVE DIAGNOSES: 1. Neurogenic claudication 2. L4-5 spinal stenosis, severe 3. L4-5 degenerative disc disease and spondylolisthesis   PROCEDURES: 1. L4/5 decompression 2. Left-sided L4-5 transforaminal lumbar interbody fusion. 3. Right-sided L4-5 posterolateral fusion. 4. Insertion of interbody device x1 (94mm Concorde intervertebral spacer). 5. Placement of segmental posterior instrumentation L4, L5 bilaterally  6. Use of local autograft. 7. Use of morselized allograft - Vivigen 8. Intraoperative use of fluoroscopy.   SURGEON:  Estill Bamberg, MD.   ASSISTANTJason Coop, PA-C.   ANESTHESIA:  General endotracheal anesthesia.   COMPLICATIONS:  None.   DISPOSITION:  Stable.   ESTIMATED BLOOD LOSS:  100cc   INDICATIONS FOR SURGERY:  Briefly, [name] is a pleasant 44 year old male  who did present to me with severe and ongoing pain in the right and left legs. I did feel that the symptoms were secondary to the findings noted above, s/p a work injury that occurred in October of 2020. The patient failed conservative care and did wish to proceed with the procedure  noted above.   OPERATIVE DETAILS:  On 11/18/2019, the patient was brought to surgery and general endotracheal anesthesia was administered.  The patient was placed prone on a well-padded flat Dawson bed with a spinal frame.  Antibiotics were given and a time-out procedure was performed. The back was prepped and draped in the usual fashion.  A midline incision was made overlying the L4-5 intervertebral spaces.  The fascia was incised at the midline.  The paraspinal musculature was  bluntly swept laterally.  Anatomic landmarks for the pedicles were exposed. Using fluoroscopy, I did cannulate the L4 and L5 pedicles bilaterally, using a medial to lateral cortical trajectory technique.  At this point, 6 x 30 mm screws were placed into the right pedicles, and a 35 mm rod was placed into the tulip heads of the screw, and caps were also placed. Distraction was then applied across the L4-5 intervertebral space, and the caps were then provisionally tightened.  On the left side, bone wax was placed into the cannulated pedicle holes.  I then proceeded with the decompressive aspect of the procedure at the L4-5 level.  A partial facetectomy was performed bilaterally at L4-5, decompressing the L4-5 intervertebral space.  I was very pleased with the decompression. With an assistant holding medial retraction of the traversing left L5 nerve, I did perform an annulotomy at the posterolateral aspect of the L4-5 intervertebral space.  I then used a series of curettes and pituitary rongeurs to perform a thorough and complete intervertebral diskectomy.  The intervertebral space was then liberally packed with autograft as well as allograft in the form of Vivigen, as was the appropriate-sized intervertebral spacer (11 mm, lordotic).  The spacer was then tamped into position in the usual fashion.  I was very pleased with the press-fit of the spacer.  I then placed 6 mm screws on the left at L4 and L5. A 35-mm rod was then placed and caps were placed. The distraction was then released on the contralateral side.  All caps were then locked.  The wound was copiously irrigated with a total of approximately 3 L prior to placing the bone graft.  Additional autograft and allograft was then packed into the posterolateral gutter on the right side to help aid in the success of the fusion.  The wound was  explored for any undue bleeding and there was no substantial bleeding encountered.  Gel-Foam was placed over the  laminectomy site.  The wound was then closed in layers using #1 Vicryl followed by 2-0 Vicryl, followed by 4-0 Monocryl.  Benzoin and Steri-Strips were applied followed by sterile dressing.    Of note, did use triggered EMG to test the screws on the left, and there was  no screw the tested below 15 mA. There was no sustained abnormal EMG activity noted throughout the surgery.   Of note, Jason Coop was my assistant throughout surgery, and did aid in retraction, suctioning, and closure.       Estill Bamberg, MD

## 2019-11-18 NOTE — Transfer of Care (Signed)
Immediate Anesthesia Transfer of Care Note  Patient: Gregory Dawson  Procedure(s) Performed: LEFT-SIDED LUMBAR 4- LUMBAR 5 TRANSFORAMINAL LUMBAR INTERBODY FUSION WITH INSTRUMENTATION AND ALLOGRAFT (Left Back)  Patient Location: PACU  Anesthesia Type:General  Level of Consciousness: drowsy, patient cooperative and responds to stimulation  Airway & Oxygen Therapy: Patient Spontanous Breathing and Patient connected to face mask oxygen  Post-op Assessment: Report given to RN, Post -op Vital signs reviewed and stable and Patient moving all extremities X 4  Post vital signs: Reviewed and stable  Last Vitals:  Vitals Value Taken Time  BP 161/104 11/18/19 1307  Temp    Pulse 90 11/18/19 1307  Resp 13 11/18/19 1307  SpO2 100 % 11/18/19 1307  Vitals shown include unvalidated device data.  Last Pain:  Vitals:   11/18/19 0735  TempSrc: Oral  PainSc:          Complications: No complications documented.

## 2019-11-19 MED FILL — Thrombin (Recombinant) For Soln 20000 Unit: CUTANEOUS | Qty: 1 | Status: AC

## 2019-11-19 NOTE — Evaluation (Signed)
Occupational Therapy Evaluation Patient Details Name: Gregory Dawson MRN: [id number] DOB: 03/02/1976 Today's Date: 11/19/2019    History of Present Illness This 44 y.o. male admitted for L4/5 Transforaminal lumbar interbody fusion due to severe spinal stenosis.  PMH includes:  HTN, asthma    Clinical Impression   Patient evaluated by Occupational Therapy with no further acute OT needs identified. All education has been completed and the patient has no further questions. All education completed. He requires supervision, increased time and effort to complete ADLs.  See below for any follow-up Occupational Therapy or equipment needs. OT is signing off. Thank you for this referral.        Follow Up Recommendations  No OT follow up;Supervision - Intermittent    Equipment Recommendations  None recommended by OT    Recommendations for Other Services       Precautions / Restrictions Precautions Precautions: Back Precaution Booklet Issued: Yes (comment) Precaution Comments: reviewed back precautions with him.  He requires min cues to avoide bending with transitional movements  Required Braces or Orthoses: Spinal Brace Spinal Brace: Thoracolumbosacral orthotic;Applied in sitting position      Mobility Bed Mobility Overal bed mobility: Needs Assistance Bed Mobility: Sidelying to Sit   Sidelying to sit: Supervision       General bed mobility comments: requires increased time and effort.  Heavy reliance on bed rail   Transfers Overall transfer level: Needs assistance Equipment used: Rolling walker (2 wheeled) Transfers: Sit to/from [employer] Sit to Stand: Supervision Stand pivot transfers: Supervision       General transfer comment: verbal cues for hand placement and to avoid bending forward     Balance                                           ADL either performed or assessed with clinical judgement   ADL Overall ADL's : Needs  assistance/impaired Eating/Feeding: Independent   Grooming: Wash/dry hands;Wash/dry face;Oral care;Supervision/safety;Standing Grooming Details (indicate cue type and reason): reviewed safe technique for grooming standing at sink  Upper Body Bathing: Set up;Supervision/ safety;Sitting   Lower Body Bathing: Supervison/ safety;Sit to/from stand Lower Body Bathing Details (indicate cue type and reason): able to perform figure 4.  Increased time and effort to move into standing  Upper Body Dressing : Set up;Supervision/safety;Sitting   Lower Body Dressing: Supervision/safety;Sit to/from stand Lower Body Dressing Details (indicate cue type and reason): able to perform figure 4.  Requires increased time and effort to move sit to stand.  Reviewed safety when pulling pants over hips with RW  Toilet Transfer: Supervision/safety;Ambulation;Comfort height toilet;Grab bars;RW Statistician Details (indicate cue type and reason): moves slowly and is guarded due to back spasms  Toileting- Clothing Manipulation and Hygiene: Supervision/safety;Sit to/from stand       Functional mobility during ADLs: Supervision/safety;Rolling walker       Vision         Perception     Praxis      Pertinent Vitals/Pain Pain Assessment: Faces Faces Pain Scale: Hurts even more Pain Location: back  Pain Descriptors / Indicators: Grimacing;Guarding;Operative site guarding;Spasm Pain Intervention(s): Monitored during session;Repositioned     Hand Dominance Right   Extremity/Trunk Assessment Upper Extremity Assessment Upper Extremity Assessment: Overall WFL for tasks assessed   Lower Extremity Assessment Lower Extremity Assessment: Defer to PT evaluation   Cervical / Trunk Assessment Cervical /  Trunk Assessment: Other exceptions Cervical / Trunk Exceptions: s/p lumbar fusion    Communication Communication Communication: No difficulties   Cognition Arousal/Alertness: Awake/alert Behavior During  Therapy: WFL for tasks assessed/performed Overall Cognitive Status: Within Functional Limits for tasks assessed                                     General Comments  reviewed safety with IADLs     Exercises     Shoulder Instructions      Home Living Family/patient expects to be discharged to:: Private residence Living Arrangements: Parent Available Help at Discharge: Available PRN/intermittently;Friend(s) Type of Home: House Home Access: Ramped entrance     Home Layout: One level     Bathroom Shower/Tub: Tub/shower unit;Walk-in shower   Bathroom Toilet: Standard     Home Equipment: Shower seat;Grab bars - tub/shower;Adaptive equipment Adaptive Equipment: Reacher Additional Comments: Pt reports he lives with elderly parents, whom he has to assist occasionally.  He does report friends who are willing to assist       Prior Functioning/Environment Level of Independence: Independent                 OT Problem List: Decreased activity tolerance;Impaired balance (sitting and/or standing);Decreased safety awareness;Decreased knowledge of use of DME or AE;Decreased knowledge of precautions;Pain      OT Treatment/Interventions:      OT Goals(Current goals can be found in the care plan section) Acute Rehab OT Goals Patient Stated Goal: to have less spasming  OT Goal Formulation: All assessment and education complete, DC therapy  OT Frequency:     Barriers to D/C:            Co-evaluation              AM-PAC OT "6 Clicks" Daily Activity     Outcome Measure Help from another person eating meals?: None Help from another person taking care of personal grooming?: A Little Help from another person toileting, which includes using toliet, bedpan, or urinal?: A Little Help from another person bathing (including washing, rinsing, drying)?: A Little Help from another person to put on and taking off regular upper body clothing?: A Little Help from  another person to put on and taking off regular lower body clothing?: A Little 6 Click Score: 19   End of Session Equipment Utilized During Treatment: Rolling walker;Back brace Nurse Communication: Mobility status  Activity Tolerance: Patient limited by pain Patient left: Other (comment) (with PT )  OT Visit Diagnosis: Pain Pain - part of body:  (back )                Time: 0922-0959 OT Time Calculation (min): 37 min Charges:  OT General Charges $OT Visit: 1 Visit OT Evaluation $OT Eval Moderate Complexity: 1 Mod OT Treatments $Self Care/Home Management : 8-22 mins  Eber Jones., OTR/L Acute Rehabilitation Services Pager [phone] Office [phone]   Jeani Hawking M 11/19/2019, 10:12 AM

## 2019-11-19 NOTE — Evaluation (Signed)
Physical Therapy Evaluation Patient Details Name: Gregory Dawson MRN: [id number] DOB: Jan 03, 1976 Today's Date: 11/19/2019   History of Present Illness  This 44 y.o. male admitted for L L4-L5 TLIF, R L4-L5 PLIF due to severe spinal stenosis.  PMH includes:  HTN, asthma   Clinical Impression  Pt admitted with above diagnosis. At the time of PT eval, pt was able to demonstrate transfers and ambulation with gross supervision for safety and RW for support. Pt was educated on precautions, brace application/wearing schedule, appropriate activity progression, and car transfer. Pt currently with functional limitations due to the deficits listed below (see PT Problem List). Pt will benefit from skilled PT to increase their independence and safety with mobility to allow discharge to the venue listed below.      Follow Up Recommendations No PT follow up;Supervision for mobility/OOB    Equipment Recommendations  Rolling walker with 5" wheels    Recommendations for Other Services       Precautions / Restrictions Precautions Precautions: Back Precaution Booklet Issued: Yes (comment) Precaution Comments: Pt was cued for precautions during functional mobility.  Required Braces or Orthoses: Spinal Brace Spinal Brace: Thoracolumbosacral orthotic;Applied in sitting position Restrictions Weight Bearing Restrictions: No      Mobility  Bed Mobility Overal bed mobility: Needs Assistance Bed Mobility: Sidelying to Sit   Sidelying to sit: Supervision       General bed mobility comments: Pt was received in the hall with OT  Transfers Overall transfer level: Needs assistance Equipment used: Rolling walker (2 wheeled) Transfers: Sit to/from Stand Sit to Stand: Supervision Stand pivot transfers: Supervision       General transfer comment: VC's for hand placement on seated surface for safety as pt was leaning onto the walker and pushing up from handles.   Ambulation/Gait Ambulation/Gait  assistance: Supervision Gait Distance (Feet): 175 Feet Assistive device: Rolling walker (2 wheeled) Gait Pattern/deviations: Step-through pattern;Decreased stride length;Trunk flexed Gait velocity: Decreased Gait velocity interpretation: <1.31 ft/sec, indicative of household ambulator General Gait Details: Slow with poor posture. VC's throughout for upright posture, closer walker proximity and forward gaze.  Stairs            Wheelchair Mobility    Modified Rankin (Stroke Patients Only)       Balance Overall balance assessment: Needs assistance Sitting-balance support: Feet supported Sitting balance-Leahy Scale: Fair     Standing balance support: Bilateral upper extremity supported;During functional activity Standing balance-Leahy Scale: Poor Standing balance comment: Reliant on UE support                             Pertinent Vitals/Pain Pain Assessment: 0-10 Pain Score: 5  Faces Pain Scale: Hurts even more Pain Location: back  Pain Descriptors / Indicators: Grimacing;Guarding;Operative site guarding;Spasm Pain Intervention(s): Limited activity within patient's tolerance;Monitored during session;Repositioned    Home Living Family/patient expects to be discharged to:: Private residence Living Arrangements: Parent Available Help at Discharge: Available PRN/intermittently;Friend(s) Type of Home: House Home Access: Ramped entrance     Home Layout: One level Home Equipment: Shower seat;Grab bars - tub/shower;Adaptive equipment Additional Comments: Pt reports he lives with elderly parents, whom he has to assist occasionally.  He does report friends who are willing to assist     Prior Function Level of Independence: Independent               Hand Dominance   Dominant Hand: Right    Extremity/Trunk Assessment   Upper  Extremity Assessment Upper Extremity Assessment: Overall WFL for tasks assessed    Lower Extremity Assessment Lower  Extremity Assessment: Generalized weakness (Consistent with pre-op diagnosis)    Cervical / Trunk Assessment Cervical / Trunk Assessment: Other exceptions Cervical / Trunk Exceptions: s/p lumbar fusion   Communication   Communication: No difficulties  Cognition Arousal/Alertness: Awake/alert Behavior During Therapy: WFL for tasks assessed/performed Overall Cognitive Status: Within Functional Limits for tasks assessed                                        General Comments General comments (skin integrity, edema, etc.): reviewed safety with IADLs     Exercises     Assessment/Plan    PT Assessment Patient needs continued PT services  PT Problem List Decreased strength;Decreased activity tolerance;Decreased balance;Decreased mobility;Decreased knowledge of use of DME;Decreased safety awareness;Pain;Cardiopulmonary status limiting activity;Decreased knowledge of precautions       PT Treatment Interventions DME instruction;Gait training;Functional mobility training;Therapeutic activities;Therapeutic exercise;Patient/family education    PT Goals (Current goals can be found in the Care Plan section)  Acute Rehab PT Goals Patient Stated Goal: to have less spasming - one more night in the hospital PT Goal Formulation: With patient Time For Goal Achievement: 11/26/19 Potential to Achieve Goals: Good    Frequency Min 5X/week   Barriers to discharge        Co-evaluation               AM-PAC PT "6 Clicks" Mobility  Outcome Measure Help needed turning from your back to your side while in a flat bed without using bedrails?: None Help needed moving from lying on your back to sitting on the side of a flat bed without using bedrails?: A Little Help needed moving to and from a bed to a chair (including a wheelchair)?: A Little Help needed standing up from a chair using your arms (e.g., wheelchair or bedside chair)?: A Little Help needed to walk in hospital room?:  A Little Help needed climbing 3-5 steps with a railing? : A Little 6 Click Score: 19    End of Session Equipment Utilized During Treatment: Back brace Activity Tolerance: Patient tolerated treatment well Patient left: in chair;with call bell/phone within reach Nurse Communication: Mobility status PT Visit Diagnosis: Unsteadiness on feet (R26.81);Pain Pain - part of body:  (back)    Time: 7169-6789 PT Time Calculation (min) (ACUTE ONLY): 20 min   Charges:   PT Evaluation $PT Eval Low Complexity: 1 Low          Conni Slipper, PT, DPT Acute Rehabilitation Services Pager: [phone] Office: [phone]   Marylynn Pearson 11/19/2019, 10:55 AM

## 2019-11-19 NOTE — TOC Transition Note (Addendum)
Transition of Care [facility]) - CM/SW Discharge Note   Patient Details  Name: Jeb Schloemer MRN: [id number] Date of Birth: 06-May-1975  Transition of Care [facility] CM/SW Contact:  Beckie Busing, RN Phone Number: [phone]  11/19/2019, 1:56 PM   Clinical Narrative:   CM called to speak with patient about home PT. PT noted show no recommendations for HHPT at 1357. CM at bedside to speak with patient about home health and to obtain contact info for patient workers comp representative. Patient is unable to provide this information stating that he does not deal with workers comp directly and he has an Pensions consultant. CM has explained to patient that she will attempt to locate someone at workers comp but could not guarantee that [facility] would be set up because it requires approval. Patient verbalized understanding. Bedside nurse has been made aware of above and that there is currently no HH PT set up. CM will attempt to locate workers comp representative and will update chart.    8/19 @1410  CM has attempted to locate workers comp representative by calling number listed under claim info on the face sheet. [phone]) Message left in reference to member ID [id number]. Will await return call. CM unable to set up home health.     Final next level of care: Home/Self Care Barriers to Discharge: No Barriers Identified   Patient Goals and CMS Choice        Discharge Placement                       Discharge Plan and Services                DME Arranged: N/A (provided by unit)         HH Arranged: NA          Social Determinants of Health (SDOH) Interventions     Readmission Risk Interventions No flowsheet data found.

## 2019-11-19 NOTE — Plan of Care (Signed)
Pt and father given D/C instructions with verbal understanding. Rx's were sent to the pharmacy by MD. Pt's incision is clean and dry with no sign of infection. Pt's IV was removed prior to D/C. Pt received RW and 3-n-1 per MD order. Pt was unable to tell Samson Frederic, TOC the contact information for his [employer] Case, so Home Health was unable to be arranged. Pt D/C'd home via wheelchair per MD order. Pt is stable @ D/C and has no other needs at this time. Rema Fendt, RN

## 2019-11-19 NOTE — Progress Notes (Signed)
Physical Therapy Treatment Patient Details Name: Gregory Dawson MRN: [id number] DOB: 16-Jun-1975 Today's Date: 11/19/2019    History of Present Illness This 44 y.o. male admitted for L L4-L5 TLIF, R L4-L5 PLIF due to severe spinal stenosis.  PMH includes:  HTN, asthma     PT Comments    PT returned to see pt for a second session per RN request as pt reports feeling uncomfortable returning home today. Pt not able to verbalize specifically what he has concerns about, just stating "I don't feel comfortable, I'm not ready." Pt is currently functioning at a gross supervision level with occasional min guard assist for safety during times when pt is reporting muscle spasms. No overt LOB noted. Pt's father present throughout session. Per RN, pt will be discharged this afternoon. After session, pt reports no further questions however still reports feeling uncomfortable with d/c. RN aware. Will continue to follow until d/c.     Follow Up Recommendations  Home health PT;Supervision for mobility/OOB     Equipment Recommendations  Rolling walker with 5" wheels    Recommendations for Other Services       Precautions / Restrictions Precautions Precautions: Back Precaution Booklet Issued: Yes (comment) Precaution Comments: Pt was cued for precautions during functional mobility.  Required Braces or Orthoses: Spinal Brace Spinal Brace: Thoracolumbosacral orthotic;Applied in sitting position Restrictions Weight Bearing Restrictions: No    Mobility  Bed Mobility               General bed mobility comments: Pt was received sitting up in the recliner chair.   Transfers Overall transfer level: Needs assistance Equipment used: Rolling walker (2 wheeled) Transfers: Sit to/from Stand Sit to Stand: Supervision;Min guard         General transfer comment: Supervision from the recliner, but min guard when attempting to sit to the toilet and stand from the toilet. Reported muscle spasms limiting  pt. No assist provided but hands-on guarding provided for safety.   Ambulation/Gait Ambulation/Gait assistance: Supervision Gait Distance (Feet): 200 Feet Assistive device: Rolling walker (2 wheeled) Gait Pattern/deviations: Step-through pattern;Decreased stride length;Trunk flexed Gait velocity: Decreased Gait velocity interpretation: <1.31 ft/sec, indicative of household ambulator General Gait Details: Slow with poor posture. VC's throughout for upright posture, closer walker proximity and forward gaze.   Stairs             Wheelchair Mobility    Modified Rankin (Stroke Patients Only)       Balance Overall balance assessment: Needs assistance Sitting-balance support: Feet supported Sitting balance-Leahy Scale: Fair     Standing balance support: Bilateral upper extremity supported;During functional activity Standing balance-Leahy Scale: Poor Standing balance comment: Reliant on UE support                            Cognition Arousal/Alertness: Awake/alert Behavior During Therapy: WFL for tasks assessed/performed Overall Cognitive Status: Within Functional Limits for tasks assessed                                        Exercises      General Comments        Pertinent Vitals/Pain Pain Assessment: Faces Faces Pain Scale: Hurts whole lot Pain Location: back  Pain Descriptors / Indicators: Grimacing;Guarding;Operative site guarding;Spasm Pain Intervention(s): Limited activity within patient's tolerance;Monitored during session;Repositioned    Home Living  Prior Function            PT Goals (current goals can now be found in the care plan section) Acute Rehab PT Goals Patient Stated Goal: to have less spasming - one more night in the hospital PT Goal Formulation: With patient Time For Goal Achievement: 11/26/19 Potential to Achieve Goals: Good Progress towards PT goals: Progressing toward  goals    Frequency    Min 5X/week      PT Plan Discharge plan needs to be updated    Co-evaluation              AM-PAC PT "6 Clicks" Mobility   Outcome Measure  Help needed turning from your back to your side while in a flat bed without using bedrails?: None Help needed moving from lying on your back to sitting on the side of a flat bed without using bedrails?: A Little Help needed moving to and from a bed to a chair (including a wheelchair)?: A Little Help needed standing up from a chair using your arms (e.g., wheelchair or bedside chair)?: A Little Help needed to walk in hospital room?: None Help needed climbing 3-5 steps with a railing? : A Little 6 Click Score: 20    End of Session Equipment Utilized During Treatment: Back brace Activity Tolerance: Patient tolerated treatment well Patient left: with call bell/phone within reach;with family/visitor present (Sitting EOB) Nurse Communication: Mobility status PT Visit Diagnosis: Unsteadiness on feet (R26.81);Pain Pain - part of body:  (back)     Time: 6812-7517 PT Time Calculation (min) (ACUTE ONLY): 32 min  Charges:  $Gait Training: 8-22 mins $Self Care/Home Management: 8-22                     Conni Slipper, PT, DPT Acute Rehabilitation Services Pager: [phone] Office: [phone]    Marylynn Pearson 11/19/2019, 3:11 PM

## 2019-11-19 NOTE — Progress Notes (Signed)
    Patient doing well PO day 1 1, resolved leg symptoms and expected PO LBP predominantly stiffness/spasm. NL B/B function. Eating well, brace at bedside.   Physical Exam: Vitals:   11/19/19 0443 11/19/19 0805  BP: 124/84 134/90  Pulse: 87 90  Resp: 18 18  Temp: 98.2 F (36.8 C) 98.2 F (36.8 C)  SpO2: 96% 93%    Dressing in place, CDI, pt sitting up comfortably eating NVI  POD #1 s/p L4-5 L TLIF, doing well   - up with PT/OT, encourage ambulation - Percocet for pain, Robaxin for muscle spasms  -Scripts sent to pharm elect - likely d/c home today with f/u in 2 weeks

## 2019-11-20 ENCOUNTER — Encounter (HOSPITAL_COMMUNITY): Payer: Self-pay | Admitting: Orthopedic Surgery

## 2019-11-25 NOTE — Discharge Summary (Signed)
Patient ID: Gregory Dawson MRN: [id number] DOB/AGE: 1975-06-08 44 y.o.  Admit date: 11/18/2019 Discharge date: 11/25/2019  Admission Diagnoses:  Active Problems:   Spinal stenosis   Discharge Diagnoses:  Same  Past Medical History:  Diagnosis Date   Arthritis    Asthma    GERD (gastroesophageal reflux disease)    History of hiatal hernia    Hypertension     Surgeries: Procedure(s): LEFT-SIDED LUMBAR 4- LUMBAR 5 TRANSFORAMINAL LUMBAR INTERBODY FUSION WITH INSTRUMENTATION AND ALLOGRAFT on 11/18/2019   Consultants: none  Discharged Condition: Improved  Hospital Course: Gregory Dawson is an 44 y.o. male who was admitted 11/18/2019 for operative treatment of radiculopathy. Patient has severe unremitting pain that affects sleep, daily activities, and work/hobbies. After pre-op clearance the patient was taken to the operating room on 11/18/2019 and underwent  Procedure(s): LEFT-SIDED LUMBAR 4- LUMBAR 5 TRANSFORAMINAL LUMBAR INTERBODY FUSION WITH INSTRUMENTATION AND ALLOGRAFT.    Patient was given perioperative antibiotics:  Anti-infectives (From admission, onward)   Start     Dose/Rate Route Frequency Ordered Stop   11/18/19 1700  ceFAZolin (ANCEF) IVPB 2g/100 mL premix        2 g 200 mL/hr over 30 Minutes Intravenous Every 8 hours 11/18/19 1420 11/19/19 0107   11/18/19 0700  ceFAZolin (ANCEF) IVPB 2g/100 mL premix        2 g 200 mL/hr over 30 Minutes Intravenous On call to O.R. 11/18/19 0655 11/18/19 0900       Patient was given sequential compression devices, early ambulation to prevent DVT.  Patient benefited maximally from hospital stay and there were no complications.    Recent vital signs: BP 134/90 (BP Location: Right Arm)    Pulse 90    Temp 98.2 F (36.8 C) (Oral)    Resp 18    Ht 5\' 6"  (1.676 m)    Wt 81.6 kg    SpO2 93%    BMI 29.05 kg/m    Discharge Medications:   Allergies as of 11/19/2019   No Known Allergies     Medication List    TAKE these  medications   famotidine 20 MG tablet Commonly known as: PEPCID Take 20 mg by mouth 2 (two) times daily.   Goodys Extra Strength 11/21/2019 MG Pack Generic drug: Aspirin-Acetaminophen-Caffeine Take 1 packet by mouth daily as needed (pain/headache).   lisinopril 10 MG tablet Commonly known as: ZESTRIL Take 10 mg by mouth daily.   methocarbamol 500 MG tablet Commonly known as: ROBAXIN Take 1 tablet (500 mg total) by mouth every 6 (six) hours as needed for muscle spasms.   oxyCODONE-acetaminophen 5-325 MG tablet Commonly known as: PERCOCET/ROXICET Take 1-2 tablets by mouth every 4 (four) hours as needed for moderate pain or severe pain.       Diagnostic Studies: DG Lumbar Spine 2-3 Views  Result Date: 11/18/2019 CLINICAL DATA:  L4-5 TLIF EXAM: DG C-ARM 1-60 MIN; LUMBAR SPINE - 2-3 VIEW COMPARISON:  Localizing images lumbar spine 11/18/2019 FINDINGS: Bilateral pedicle screw and interbody fusion at L4-5 in satisfactory position. L5-S1 is considered the lowest disc space. Normal alignment. No acute skeletal abnormality. IMPRESSION: Satisfactory pedicle screw and interbody fusion L4-5. Electronically Signed   By: 11/20/2019 M.D.   On: 11/18/2019 14:24   DG Lumbar Spine 1 View  Result Date: 11/18/2019 CLINICAL DATA:  Needle localization for back surgery EXAM: LUMBAR SPINE - 1 VIEW COMPARISON:  None. FINDINGS: Single lateral view of the lumbar spine is obtained in the operating room.  Lowest disc space is assumed to be L5-S1. Needle in the posterior soft tissues at the level of L5-S1. Second needle the level of S2-3. IMPRESSION: Lumbar localization in the operating room as above. Electronically Signed   By: Marlan Palau M.D.   On: 11/18/2019 14:22   DG C-Arm 1-60 Min  Result Date: 11/18/2019 CLINICAL DATA:  L4-5 TLIF EXAM: DG C-ARM 1-60 MIN; LUMBAR SPINE - 2-3 VIEW COMPARISON:  Localizing images lumbar spine 11/18/2019 FINDINGS: Bilateral pedicle screw and interbody fusion at L4-5 in  satisfactory position. L5-S1 is considered the lowest disc space. Normal alignment. No acute skeletal abnormality. IMPRESSION: Satisfactory pedicle screw and interbody fusion L4-5. Electronically Signed   By: Marlan Palau M.D.   On: 11/18/2019 14:24    Disposition: Discharge disposition: 01-Home or Self Care       Discharge Instructions    Discharge patient   Complete by: As directed    Discharge disposition: 01-Home or Self Care   Discharge patient date: 11/19/2019     POD #1 s/p L4-5 L TLIF, doing well   - up with PT/OT, encourage ambulation - Percocet for pain, Robaxin for muscle spasms -Scripts for pain sent to pharmacy electronically  -D/C instructions sheet printed and in chart -D/C today  -F/U in office 2 weeks   Signed: Georga Bora 11/25/2019, 12:35 PM

## 2021-02-06 IMAGING — CR DG LUMBAR SPINE 1V
1 series · 1 of 1 positions shown · non-contrast
Comparison: None.

CLINICAL DATA: Needle localization for back surgery

EXAM:
LUMBAR SPINE - 1 VIEW

[lateral]
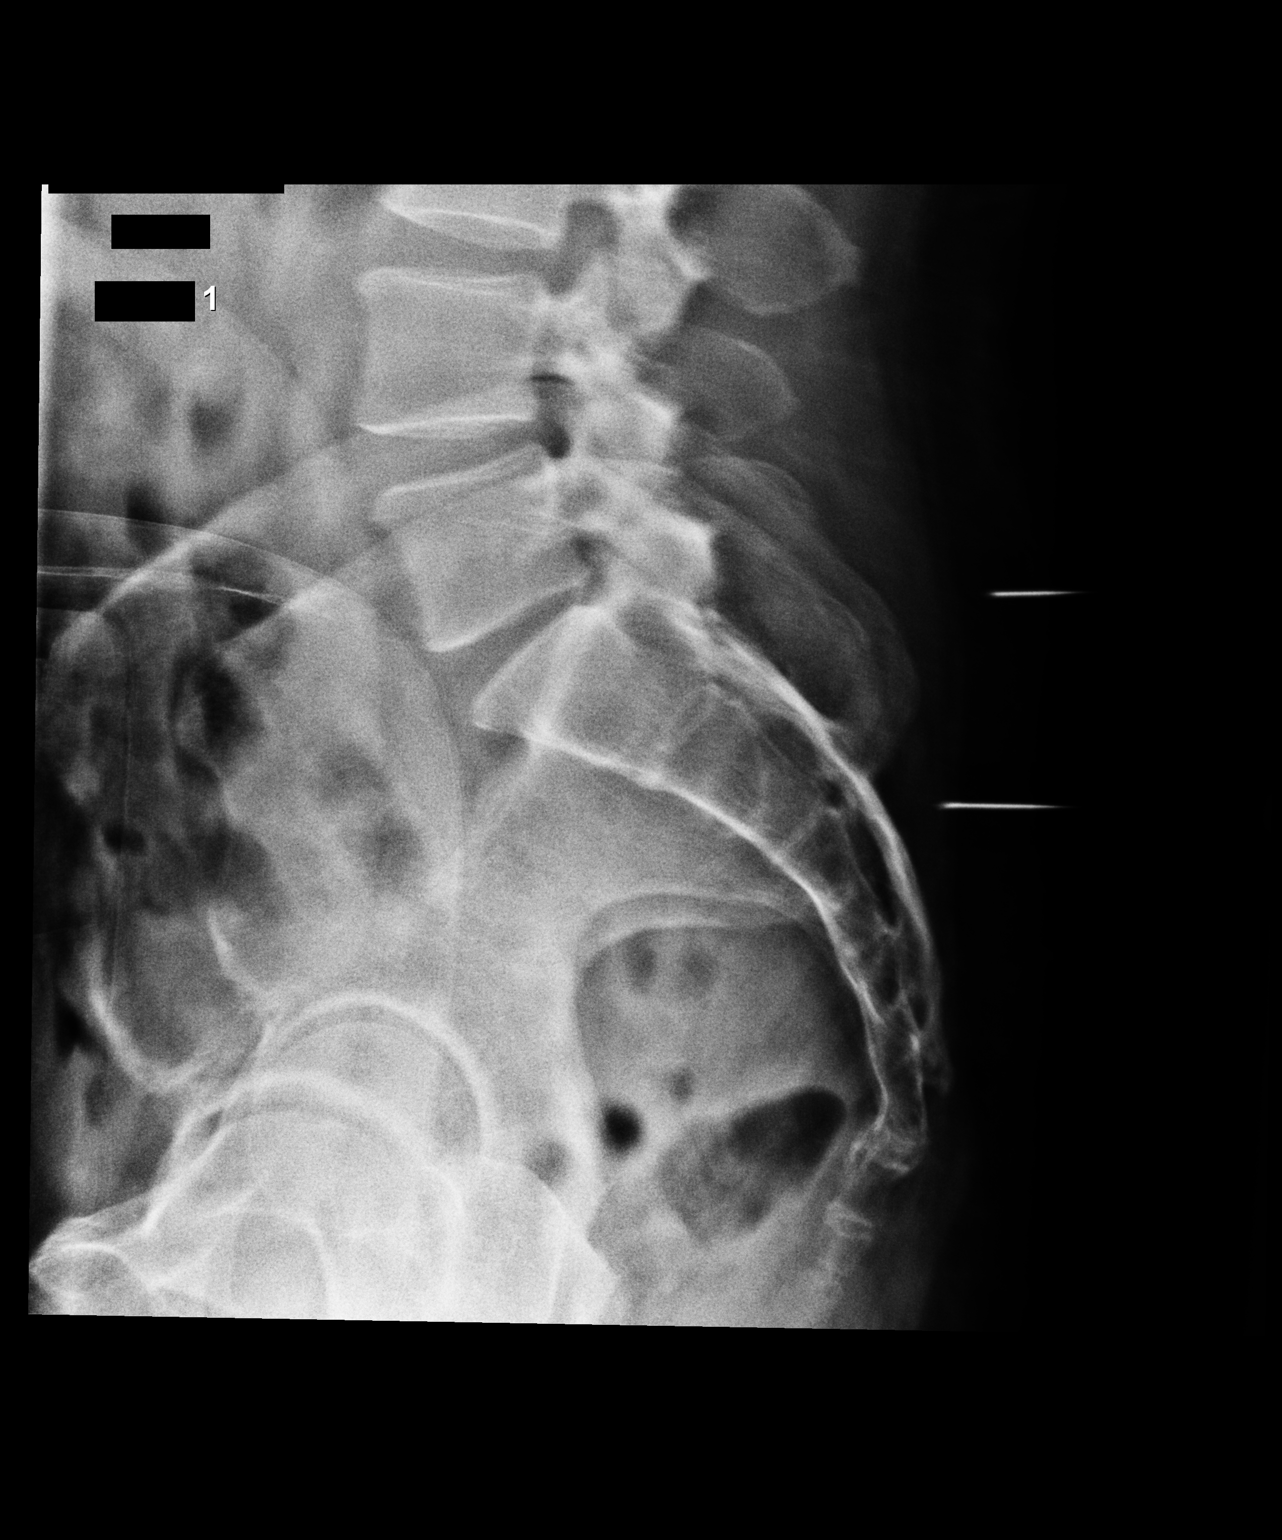

[1 of 1 positions shown; findings below may reference images not displayed]

FINDINGS: Single lateral view of the lumbar spine is obtained in the operating
room.

Lowest disc space is assumed to be L5-S1.

Needle in the posterior soft tissues at the level of L5-S1. Second
needle the level of S2-3.
IMPRESSION: Lumbar localization in the operating room as above.

## 2023-10-23 ENCOUNTER — Other Ambulatory Visit: Payer: Self-pay | Admitting: Orthopedic Surgery

## 2023-10-23 DIAGNOSIS — M5416 Radiculopathy, lumbar region: Secondary | ICD-10-CM

## 2023-10-23 DIAGNOSIS — M542 Cervicalgia: Secondary | ICD-10-CM

## 2023-12-23 ENCOUNTER — Ambulatory Visit
Admission: RE | Admit: 2023-12-23 | Discharge: 2023-12-23 | Disposition: A | Discharge status: Home / Self Care | Source: Ambulatory Visit | Attending: Orthopedic Surgery | Admitting: Orthopedic Surgery

## 2023-12-23 DIAGNOSIS — M542 Cervicalgia: Secondary | ICD-10-CM

## 2023-12-23 DIAGNOSIS — M5416 Radiculopathy, lumbar region: Secondary | ICD-10-CM
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
## 2024-01-28 ENCOUNTER — Ambulatory Visit: Attending: Orthopedic Surgery | Admitting: Rehabilitation

## 2024-01-28 ENCOUNTER — Encounter: Payer: Self-pay | Admitting: Rehabilitation

## 2024-01-28 ENCOUNTER — Other Ambulatory Visit: Payer: Self-pay

## 2024-01-28 DIAGNOSIS — R293 Abnormal posture: Secondary | ICD-10-CM | POA: Diagnosis present

## 2024-01-28 DIAGNOSIS — M6281 Muscle weakness (generalized): Secondary | ICD-10-CM | POA: Insufficient documentation

## 2024-01-28 DIAGNOSIS — M542 Cervicalgia: Secondary | ICD-10-CM | POA: Insufficient documentation

## 2024-02-05 ENCOUNTER — Ambulatory Visit

## 2024-02-11 ENCOUNTER — Ambulatory Visit: Admitting: Rehabilitation
